# Patient Record
Sex: Female | Born: 1985 | Race: White | Hispanic: No | Marital: Single | Smoking: Former smoker
Health system: Southern US, Community
[De-identification: ages and names within clinical notes are randomized; demographics above are authoritative.]

## PROBLEM LIST (undated history)

## (undated) DIAGNOSIS — D649 Anemia, unspecified: Secondary | ICD-10-CM

## (undated) DIAGNOSIS — B009 Herpesviral infection, unspecified: Secondary | ICD-10-CM

## (undated) DIAGNOSIS — F419 Anxiety disorder, unspecified: Secondary | ICD-10-CM

## (undated) DIAGNOSIS — J45909 Unspecified asthma, uncomplicated: Secondary | ICD-10-CM

## (undated) DIAGNOSIS — Z22322 Carrier or suspected carrier of Methicillin resistant Staphylococcus aureus: Secondary | ICD-10-CM

## (undated) DIAGNOSIS — I10 Essential (primary) hypertension: Secondary | ICD-10-CM

## (undated) HISTORY — PX: OTHER SURGICAL HISTORY: SHX169

---

## 2015-11-11 ENCOUNTER — Encounter (HOSPITAL_COMMUNITY): Payer: Self-pay | Admitting: Emergency Medicine

## 2015-11-11 ENCOUNTER — Emergency Department (HOSPITAL_COMMUNITY): Payer: Self-pay

## 2015-11-11 ENCOUNTER — Emergency Department (HOSPITAL_COMMUNITY)
Admission: EM | Admit: 2015-11-11 | Discharge: 2015-11-11 | Disposition: A | Discharge status: Home / Self Care | Payer: Self-pay | Attending: Emergency Medicine | Admitting: Emergency Medicine

## 2015-11-11 DIAGNOSIS — Z3202 Encounter for pregnancy test, result negative: Secondary | ICD-10-CM | POA: Insufficient documentation

## 2015-11-11 DIAGNOSIS — F17200 Nicotine dependence, unspecified, uncomplicated: Secondary | ICD-10-CM | POA: Insufficient documentation

## 2015-11-11 DIAGNOSIS — R05 Cough: Secondary | ICD-10-CM | POA: Insufficient documentation

## 2015-11-11 DIAGNOSIS — R109 Unspecified abdominal pain: Secondary | ICD-10-CM

## 2015-11-11 DIAGNOSIS — N390 Urinary tract infection, site not specified: Secondary | ICD-10-CM | POA: Insufficient documentation

## 2015-11-11 DIAGNOSIS — Z87442 Personal history of urinary calculi: Secondary | ICD-10-CM | POA: Insufficient documentation

## 2015-11-11 LAB — COMPREHENSIVE METABOLIC PANEL
ALT: 19 U/L (ref 14–54)
AST: 20 U/L (ref 15–41)
Albumin: 3.7 g/dL (ref 3.5–5.0)
Alkaline Phosphatase: 81 U/L (ref 38–126)
Anion gap: 12 (ref 5–15)
BILIRUBIN TOTAL: 0.3 mg/dL (ref 0.3–1.2)
BUN: 17 mg/dL (ref 6–20)
CO2: 24 mmol/L (ref 22–32)
CREATININE: 0.84 mg/dL (ref 0.44–1.00)
Calcium: 9.1 mg/dL (ref 8.9–10.3)
Chloride: 101 mmol/L (ref 101–111)
GFR calc Af Amer: >60 mL/min (ref >60)
Glucose, Bld: 87 mg/dL (ref 65–99)
POTASSIUM: 4.1 mmol/L (ref 3.5–5.1)
Sodium: 137 mmol/L (ref 135–145)
TOTAL PROTEIN: 7.8 g/dL (ref 6.5–8.1)

## 2015-11-11 LAB — CBC
HCT: 37.7 % (ref 36.0–46.0)
Hemoglobin: 11.8 g/dL — ABNORMAL LOW (ref 12.0–15.0)
MCH: 28.4 pg (ref 26.0–34.0)
MCHC: 31.3 g/dL (ref 30.0–36.0)
MCV: 90.8 fL (ref 78.0–100.0)
PLATELETS: 380 10*3/uL (ref 150–400)
RBC: 4.15 MIL/uL (ref 3.87–5.11)
RDW: 16.3 % — AB (ref 11.5–15.5)
WBC: 12.5 10*3/uL — AB (ref 4.0–10.5)

## 2015-11-11 LAB — URINALYSIS, ROUTINE W REFLEX MICROSCOPIC
Bilirubin Urine: NEGATIVE
Glucose, UA: NEGATIVE mg/dL
Hgb urine dipstick: NEGATIVE
KETONES UR: NEGATIVE mg/dL
NITRITE: POSITIVE — AB
PROTEIN: NEGATIVE mg/dL
Specific Gravity, Urine: 1.022 (ref 1.005–1.030)
pH: 6 (ref 5.0–8.0)

## 2015-11-11 LAB — URINE MICROSCOPIC-ADD ON: RBC / HPF: NONE SEEN RBC/hpf (ref 0–5)

## 2015-11-11 LAB — POC URINE PREG, ED: Preg Test, Ur: NEGATIVE

## 2015-11-11 LAB — LIPASE, BLOOD: Lipase: 24 U/L (ref 11–51)

## 2015-11-11 MED ORDER — CEPHALEXIN 500 MG PO CAPS: 500.0000 mg | ORAL_CAPSULE | Freq: Three times a day (TID) | ORAL | Status: DC

## 2015-11-11 MED ORDER — IBUPROFEN 800 MG PO TABS: 800.0000 mg | ORAL_TABLET | Freq: Three times a day (TID) | ORAL | Status: DC | PRN

## 2015-11-11 NOTE — ED Notes (Signed)
Pt ambulating independently w/ steady gait on d/c in no acute distress, A&Ox4. D/c instructions reviewed w/ pt and family - pt and family deny any further questions or concerns at present. Rx given x2  

## 2015-11-11 NOTE — Discharge Instructions (Signed)
Read the information below.  Use the prescribed medication as directed.  Please discuss all new medications with your pharmacist.  You may return to the Emergency Department at any time for worsening condition or any new symptoms that concern you.   If you develop high fevers, worsening abdominal pain, uncontrolled vomiting, or are unable to tolerate fluids by mouth, return to the ER for a recheck.     Flank Pain Flank pain refers to pain that is located on the side of the body between the upper abdomen and the back. The pain may occur over a short period of time (acute) or may be long-term or reoccurring (chronic). It may be mild or severe. Flank pain can be caused by many things. CAUSES  Some of the more common causes of flank pain include:  Muscle strains.   Muscle spasms.   A disease of your spine (vertebral disk disease).   A lung infection (pneumonia).   Fluid around your lungs (pulmonary edema).   A kidney infection.   Kidney stones.   A very painful skin rash caused by the chickenpox virus (shingles).   Gallbladder disease.  HOME CARE INSTRUCTIONS  Home care will depend on the cause of your pain. In general,  Rest as directed by your caregiver.  Drink enough fluids to keep your urine clear or pale yellow.  Only take over-the-counter or prescription medicines as directed by your caregiver. Some medicines may help relieve the pain.  Tell your caregiver about any changes in your pain.  Follow up with your caregiver as directed. SEEK IMMEDIATE MEDICAL CARE IF:   Your pain is not controlled with medicine.   You have new or worsening symptoms.  Your pain increases.   You have abdominal pain.   You have shortness of breath.   You have persistent nausea or vomiting.   You have swelling in your abdomen.   You feel faint or pass out.   You have blood in your urine.  You have a fever or persistent symptoms for more than 2-3 days.  You have a  fever and your symptoms suddenly get worse. MAKE SURE YOU:   Understand these instructions.  Will watch your condition.  Will get help right away if you are not doing well or get worse.   This information is not intended to replace advice given to you by your health care provider. Make sure you discuss any questions you have with your health care provider.   Document Released: 10/18/2005 Document Revised: 05/21/2012 Document Reviewed: 04/10/2012 Elsevier Interactive Patient Education 2016 [employer]  Urinary Tract Infection Urinary tract infections (UTIs) can develop anywhere along your urinary tract. Your urinary tract is your body's drainage system for removing wastes and extra water. Your urinary tract includes two kidneys, two ureters, a bladder, and a urethra. Your kidneys are a pair of bean-shaped organs. Each kidney is about the size of your fist. They are located below your ribs, one on each side of your spine. CAUSES Infections are caused by microbes, which are microscopic organisms, including fungi, viruses, and bacteria. These organisms are so small that they can only be seen through a microscope. Bacteria are the microbes that most commonly cause UTIs. SYMPTOMS  Symptoms of UTIs may vary by age and gender of the patient and by the location of the infection. Symptoms in young women typically include a frequent and intense urge to urinate and a painful, burning feeling in the bladder or urethra during urination. Older women  and men are more likely to be tired, shaky, and weak and have muscle aches and abdominal pain. A fever may mean the infection is in your kidneys. Other symptoms of a kidney infection include pain in your back or sides below the ribs, nausea, and vomiting. DIAGNOSIS To diagnose a UTI, your caregiver will ask you about your symptoms. Your caregiver will also ask you to provide a urine sample. The urine sample will be tested for bacteria and white blood cells.  White blood cells are made by your body to help fight infection. TREATMENT  Typically, UTIs can be treated with medication. Because most UTIs are caused by a bacterial infection, they usually can be treated with the use of antibiotics. The choice of antibiotic and length of treatment depend on your symptoms and the type of bacteria causing your infection. HOME CARE INSTRUCTIONS  If you were prescribed antibiotics, take them exactly as your caregiver instructs you. Finish the medication even if you feel better after you have only taken some of the medication.  Drink enough water and fluids to keep your urine clear or pale yellow.  Avoid caffeine, tea, and carbonated beverages. They tend to irritate your bladder.  Empty your bladder often. Avoid holding urine for long periods of time.  Empty your bladder before and after sexual intercourse.  After a bowel movement, women should cleanse from front to back. Use each tissue only once. SEEK MEDICAL CARE IF:   You have back pain.  You develop a fever.  Your symptoms do not begin to resolve within 3 days. SEEK IMMEDIATE MEDICAL CARE IF:   You have severe back pain or lower abdominal pain.  You develop chills.  You have nausea or vomiting.  You have continued burning or discomfort with urination. MAKE SURE YOU:   Understand these instructions.  Will watch your condition.  Will get help right away if you are not doing well or get worse.   This information is not intended to replace advice given to you by your health care provider. Make sure you discuss any questions you have with your health care provider.   Document Released: 06/06/2005 Document Revised: 05/18/2015 Document Reviewed: 10/05/2011 Elsevier Interactive Patient Education [employer]

## 2015-11-11 NOTE — ED Provider Notes (Signed)
CSN: [id number]     Arrival date & time 11/11/15  1839 History  By signing my name below, I, Freida Busman, attest that this documentation has been prepared under the direction and in the presence of non-physician practitioner, Trixie Dredge, PA-C. Electronically Signed: Freida Busman, Scribe. 11/11/2015. 8:23 PM.    Chief Complaint  Patient presents with  . Abdominal Pain    The history is provided by the patient. No language interpreter was used.     HPI Comments:  Paula Mcbride is a 30 y.o. female who presents to the Emergency Department complaining of shooting constant right lateral side pain x 3-4 days. She notes her pain is worse with inspiration and cough. She denies acute trauma and exacerbation of pain after eating. Pt reports associated intermittent left pelvic pain x 8 days (since the beginning of her period) that has improved, chills, malodorous urine, states her " pee smells strong". Pt reports h/o similar pain; states she was diagnosed with a  kidney stone. Her last menstrual period started 8 days ago and ended yesterday; notes increased cramping and heavier flow but states it was normal otherwise. Pt denies dysuria, vaginal discharge, dyspareuria, fever, CP, SOB, LE swelling, and change in appetite. She has been taking tylenol with minimal relief. She denies recent surgery/long periods of immobilization, use of BCP/hormone replacement, leg swelling.  History reviewed. No pertinent past medical history. Past Surgical History  Procedure Laterality Date  . Thumb surgery     No family history on file. Social History  Substance Use Topics  . Smoking status: Current Every Day Smoker  . Smokeless tobacco: None  . Alcohol Use: No   OB History    No data available     Review of Systems  Constitutional: Negative for fever and appetite change.  Respiratory: Negative for shortness of breath.   Cardiovascular: Negative for chest pain and leg swelling.  Gastrointestinal:  Positive for abdominal pain.  Genitourinary: Negative for vaginal discharge and dyspareunia.  All other systems reviewed and are negative.   Allergies  Review of patient's allergies indicates not on file.  Home Medications   Prior to Admission medications   Medication Sig Start Date End Date Taking? Authorizing Provider  cephALEXin (KEFLEX) 500 MG capsule Take 1 capsule (500 mg total) by mouth 3 (three) times daily. 11/11/15   Trixie Dredge, PA-C  ibuprofen (ADVIL,MOTRIN) 800 MG tablet Take 1 tablet (800 mg total) by mouth every 8 (eight) hours as needed for mild pain or moderate pain. 11/11/15   Trixie Dredge, PA-C   BP 144/104 mmHg  Pulse 102  Temp(Src) 97.8 F (36.6 C) (Oral)  Resp 18  Ht  (1.575 m)  Wt 152 lb 12.8 oz (69.31 kg)  BMI 27.94 kg/m2  SpO2 99%  LMP 11/02/2015 Physical Exam  Constitutional: She appears well-developed and well-nourished. No distress.  HENT:  Head: Normocephalic and atraumatic.  Neck: Neck supple.  Cardiovascular: Normal rate and regular rhythm.   Pulmonary/Chest: Effort normal and breath sounds normal. No respiratory distress. She has no wheezes. She has no rales.  Abdominal: Soft. She exhibits no distension. There is tenderness. There is no rebound, no guarding and no CVA tenderness.  Left suprapubic tenderness No CVA tenderness  Neurological: She is alert. She exhibits normal muscle tone.  Skin: She is not diaphoretic.  Nursing note and vitals reviewed.   ED Course  Procedures   DIAGNOSTIC STUDIES:  Oxygen Saturation is 99% on RA, normal by my interpretation.  COORDINATION OF CARE:  8:16 PM Discussed treatment plan with pt at bedside and pt agreed to plan.  Labs Review Labs Reviewed  CBC - Abnormal; Notable for the following:    WBC 12.5 (*)    Hemoglobin 11.8 (*)    RDW 16.3 (*)    All other components within normal limits  URINALYSIS, ROUTINE W REFLEX MICROSCOPIC (NOT AT [facility]) - Abnormal; Notable for the following:    Nitrite  POSITIVE (*)    Leukocytes, UA SMALL (*)    All other components within normal limits  URINE MICROSCOPIC-ADD ON - Abnormal; Notable for the following:    Squamous Epithelial / LPF 0-5 (*)    Bacteria, UA MANY (*)    All other components within normal limits  WET PREP, GENITAL  LIPASE, BLOOD  COMPREHENSIVE METABOLIC PANEL  RPR  HIV ANTIBODY (ROUTINE TESTING)  POC URINE PREG, ED  GC/CHLAMYDIA PROBE AMP ([place]) NOT AT [facility]    Imaging Review Dg Chest 2 View  11/11/2015  CLINICAL DATA:  RUQ pleuritic pain x 4 days. Smoker. No other chest complaints EXAM: CHEST  2 VIEW COMPARISON:  None. FINDINGS: Lateral view degraded by patient arm position. Midline trachea. Normal heart size and mediastinal contours. No pleural effusion or pneumothorax. Clear lungs. IMPRESSION: Normal chest. Electronically Signed   By: Jeronimo GreavesKyle  Talbot M.D.   On: 11/11/2015 20:39   I have personally reviewed and evaluated these images and lab results as part of my medical decision-making.  9:37 PM Pt declined pelvic exam. Discussed with patient that without this testing we have limited information and data with which to diagnose her. She verbalizes understanding, agrees to return for worsening condition.  MDM   Final diagnoses:  Right flank pain  UTI (lower urinary tract infection)    Afebrile, nontoxic patient with right flank pain and left suprapubic pain - suprapubic cramping began at the beginning of her menstrual period 8 days ago.  Right flank pain has been associated with malodorous urine.  No fever, vomiting, bowel changes, vaginal symptoms.  Pt active, does not appear to be in any pain, eager to eat throughout visit.  UA appears infected, mild leukocytosis (WBC 12).  Workup otherwise reassuring.   Pt is not pregnant.  Pt may be developing early pyelonephritis but is not toxic or septic appearing.  Doubt PE, pt has no known risk factors.  D/C home with keflex, ibuprofen, PCP follow up.   Discussed result,  findings, treatment, and follow up  with patient.  Pt given return precautions.  Pt verbalizes understanding and agrees with plan.       I personally performed the services described in this documentation, which was scribed in my presence. The recorded information has been reviewed and is accurate.    Trixie Dredgemily Creedence Kunesh, PA-C 11/11/15 2217  Doug SouSam Jacubowitz, MD 11/12/15 0130

## 2015-11-11 NOTE — ED Notes (Signed)
C/o L sided pelvic pain and R lateral side pain x 3 days with chills and headache. Denies nausea, vomiting.  States urine is foul smelling.

## 2016-04-14 ENCOUNTER — Encounter (HOSPITAL_COMMUNITY): Payer: Self-pay | Admitting: *Deleted

## 2016-04-14 DIAGNOSIS — M545 Low back pain: Secondary | ICD-10-CM | POA: Insufficient documentation

## 2016-04-14 DIAGNOSIS — F17200 Nicotine dependence, unspecified, uncomplicated: Secondary | ICD-10-CM | POA: Insufficient documentation

## 2016-04-14 DIAGNOSIS — J45909 Unspecified asthma, uncomplicated: Secondary | ICD-10-CM | POA: Insufficient documentation

## 2016-04-14 DIAGNOSIS — R002 Palpitations: Secondary | ICD-10-CM | POA: Insufficient documentation

## 2016-04-14 LAB — URINALYSIS, ROUTINE W REFLEX MICROSCOPIC
Bilirubin Urine: NEGATIVE
Glucose, UA: NEGATIVE mg/dL
HGB URINE DIPSTICK: NEGATIVE
Ketones, ur: NEGATIVE mg/dL
LEUKOCYTES UA: NEGATIVE
Nitrite: NEGATIVE
PROTEIN: NEGATIVE mg/dL
SPECIFIC GRAVITY, URINE: 1.019 (ref 1.005–1.030)
pH: 6.5 (ref 5.0–8.0)

## 2016-04-14 LAB — CBC WITH DIFFERENTIAL/PLATELET
BASOS ABS: 0.1 10*3/uL (ref 0.0–0.1)
BASOS PCT: 1 %
EOS PCT: 3 %
Eosinophils Absolute: 0.3 10*3/uL (ref 0.0–0.7)
HCT: 39.8 % (ref 36.0–46.0)
Hemoglobin: 13.1 g/dL (ref 12.0–15.0)
Lymphocytes Relative: 41 %
Lymphs Abs: 4.5 10*3/uL — ABNORMAL HIGH (ref 0.7–4.0)
MCH: 30.6 pg (ref 26.0–34.0)
MCHC: 32.9 g/dL (ref 30.0–36.0)
MCV: 93 fL (ref 78.0–100.0)
MONO ABS: 1 10*3/uL (ref 0.1–1.0)
MONOS PCT: 9 %
Neutro Abs: 5.1 10*3/uL (ref 1.7–7.7)
Neutrophils Relative %: 46 %
PLATELETS: 259 10*3/uL (ref 150–400)
RBC: 4.28 MIL/uL (ref 3.87–5.11)
RDW: 16.4 % — AB (ref 11.5–15.5)
WBC: 11 10*3/uL — ABNORMAL HIGH (ref 4.0–10.5)

## 2016-04-14 LAB — POC URINE PREG, ED: PREG TEST UR: NEGATIVE

## 2016-04-14 NOTE — ED Triage Notes (Signed)
Patient presents stating she has been having palpitations off and on for awhile but has been persistant for the past 2-3 days

## 2016-04-15 ENCOUNTER — Emergency Department (HOSPITAL_COMMUNITY)
Admission: EM | Admit: 2016-04-15 | Discharge: 2016-04-15 | Disposition: A | Discharge status: Home / Self Care | Payer: Self-pay | Attending: Emergency Medicine | Admitting: Emergency Medicine

## 2016-04-15 DIAGNOSIS — M545 Low back pain, unspecified: Secondary | ICD-10-CM

## 2016-04-15 DIAGNOSIS — R002 Palpitations: Secondary | ICD-10-CM

## 2016-04-15 HISTORY — DX: Herpesviral infection, unspecified: B00.9

## 2016-04-15 HISTORY — DX: Unspecified asthma, uncomplicated: J45.909

## 2016-04-15 LAB — COMPREHENSIVE METABOLIC PANEL WITH GFR
ALT: 18 U/L (ref 14–54)
AST: 20 U/L (ref 15–41)
Albumin: 4 g/dL (ref 3.5–5.0)
Alkaline Phosphatase: 82 U/L (ref 38–126)
Anion gap: 7 (ref 5–15)
BUN: 21 mg/dL — ABNORMAL HIGH (ref 6–20)
CO2: 26 mmol/L (ref 22–32)
Calcium: 10.3 mg/dL (ref 8.9–10.3)
Chloride: 103 mmol/L (ref 101–111)
Creatinine, Ser: 0.74 mg/dL (ref 0.44–1.00)
GFR calc Af Amer: >60 mL/min
GFR calc non Af Amer: >60 mL/min
Glucose, Bld: 88 mg/dL (ref 65–99)
Potassium: 3.8 mmol/L (ref 3.5–5.1)
Sodium: 136 mmol/L (ref 135–145)
Total Bilirubin: 0.6 mg/dL (ref 0.3–1.2)
Total Protein: 7.3 g/dL (ref 6.5–8.1)

## 2016-04-15 MED ORDER — METHOCARBAMOL 500 MG PO TABS: 500.0000 mg | ORAL_TABLET | Freq: Two times a day (BID) | ORAL | 0 refills | Status: DC

## 2016-04-15 NOTE — ED Notes (Signed)
Pt departed in NAD, refused use of wheelchair.  

## 2016-04-15 NOTE — ED Provider Notes (Signed)
[facility]-EMERGENCY DEPT Provider Note   CSN: [id number] Arrival date & time: 04/14/16  2307  First Provider Contact:  None       History   Chief Complaint Chief Complaint  Patient presents with  . Palpitations    HPI Paula Mcbride is a 30 y.o. female.  Patient presents today with palpitations.  She states that she has been having intermittent palpitations over the past 2-3 days.  She has a history of the same.  She states that she has never had a Holter Monitor or been seen by Cardiology for this in the past.  She denies any chest pain, SOB, dizziness, or syncope.  No fever or chills.  She reports drinking one cup of coffee today, but denies any other Caffeine use.  Denies recreational drugs or alcohol use.  Denies increased anxiety.  She has not taken anything for her symptoms.       Past Medical History:  Diagnosis Date  . Asthma   . Herpes     There are no active problems to display for this patient.   Past Surgical History:  Procedure Laterality Date  . thumb surgery      OB History    No data available       Home Medications    Prior to Admission medications   Medication Sig Start Date End Date Taking? Authorizing Provider  cephALEXin (KEFLEX) 500 MG capsule Take 1 capsule (500 mg total) by mouth 3 (three) times daily. 11/11/15   Trixie DredgeEmily West, PA-C  ibuprofen (ADVIL,MOTRIN) 800 MG tablet Take 1 tablet (800 mg total) by mouth every 8 (eight) hours as needed for mild pain or moderate pain. 11/11/15   Trixie DredgeEmily West, PA-C    Family History No family history on file.  Social History Social History  Substance Use Topics  . Smoking status: Current Every Day Smoker  . Smokeless tobacco: Never Used  . Alcohol use Yes     Comment: occasionally     Allergies   Review of patient's allergies indicates no known allergies.   Review of Systems Review of Systems  All other systems reviewed and are negative.    Physical Exam Updated Vital Signs BP 135/86  (BP Location: Right Arm)   Pulse 96   Temp 98.3 F (36.8 C) (Oral)   Resp 20   Ht 5\' 2"  (1.575 m)   Wt 77 kg   LMP 03/12/2016   SpO2 100%   BMI 31.04 kg/m   Physical Exam  Constitutional: She appears well-developed and well-nourished.  HENT:  Head: Normocephalic and atraumatic.  Neck: Normal range of motion. Neck supple.  Cardiovascular: Normal rate, regular rhythm and normal heart sounds.  Exam reveals no gallop and no friction rub.   No murmur heard. Pulmonary/Chest: Effort normal and breath sounds normal.  Abdominal: Soft. There is no tenderness.  Musculoskeletal: Normal range of motion.  Neurological: She is alert.  Skin: Skin is warm and dry.  Psychiatric: She has a normal mood and affect.  Nursing note and vitals reviewed.    ED Treatments / Results  Labs (all labs ordered are listed, but only abnormal results are displayed) Labs Reviewed  CBC WITH DIFFERENTIAL/PLATELET - Abnormal; Notable for the following:       Result Value   WBC 11.0 (*)    RDW 16.4 (*)    Lymphs Abs 4.5 (*)    All other components within normal limits  COMPREHENSIVE METABOLIC PANEL - Abnormal; Notable for the following:  BUN 21 (*)    All other components within normal limits  URINALYSIS, ROUTINE W REFLEX MICROSCOPIC (NOT AT [facility])  POC URINE PREG, ED    EKG  EKG Interpretation None       Radiology No results found.  Procedures Procedures (including critical care time)  Medications Ordered in ED Medications - No data to display   Initial Impression / Assessment and Plan / ED Course  I have reviewed the triage vital signs and the nursing notes.  Pertinent labs & imaging results that were available during my care of the patient were reviewed by me and considered in my medical decision making (see chart for details).  Clinical Course    Patient presents today with palpitations that have been occurring intermittently over the past couple of days.  Normal EKG.  Labs  unremarkable.  She denies any CP or SOB.  Denies recreational drug use.  VSS.  Feel that the patient is stable for discharge.  Patient given referral to Cardiology for possible Holter Monitor.  Return precautions given.    Final Clinical Impressions(s) / ED Diagnoses   Final diagnoses:  None    New Prescriptions New Prescriptions   No medications on file     Santiago Glad, PA-C 04/16/16 2107    Tomasita Crumble, MD 04/17/16 [id number]

## 2017-01-07 IMAGING — DX DG CHEST 2V
2 series · 2 of 2 positions shown · non-contrast
Comparison: None.

CLINICAL DATA: RUQ pleuritic pain x 4 days. Smoker. No other chest
complaints

EXAM:
CHEST  2 VIEW

[chest pa]
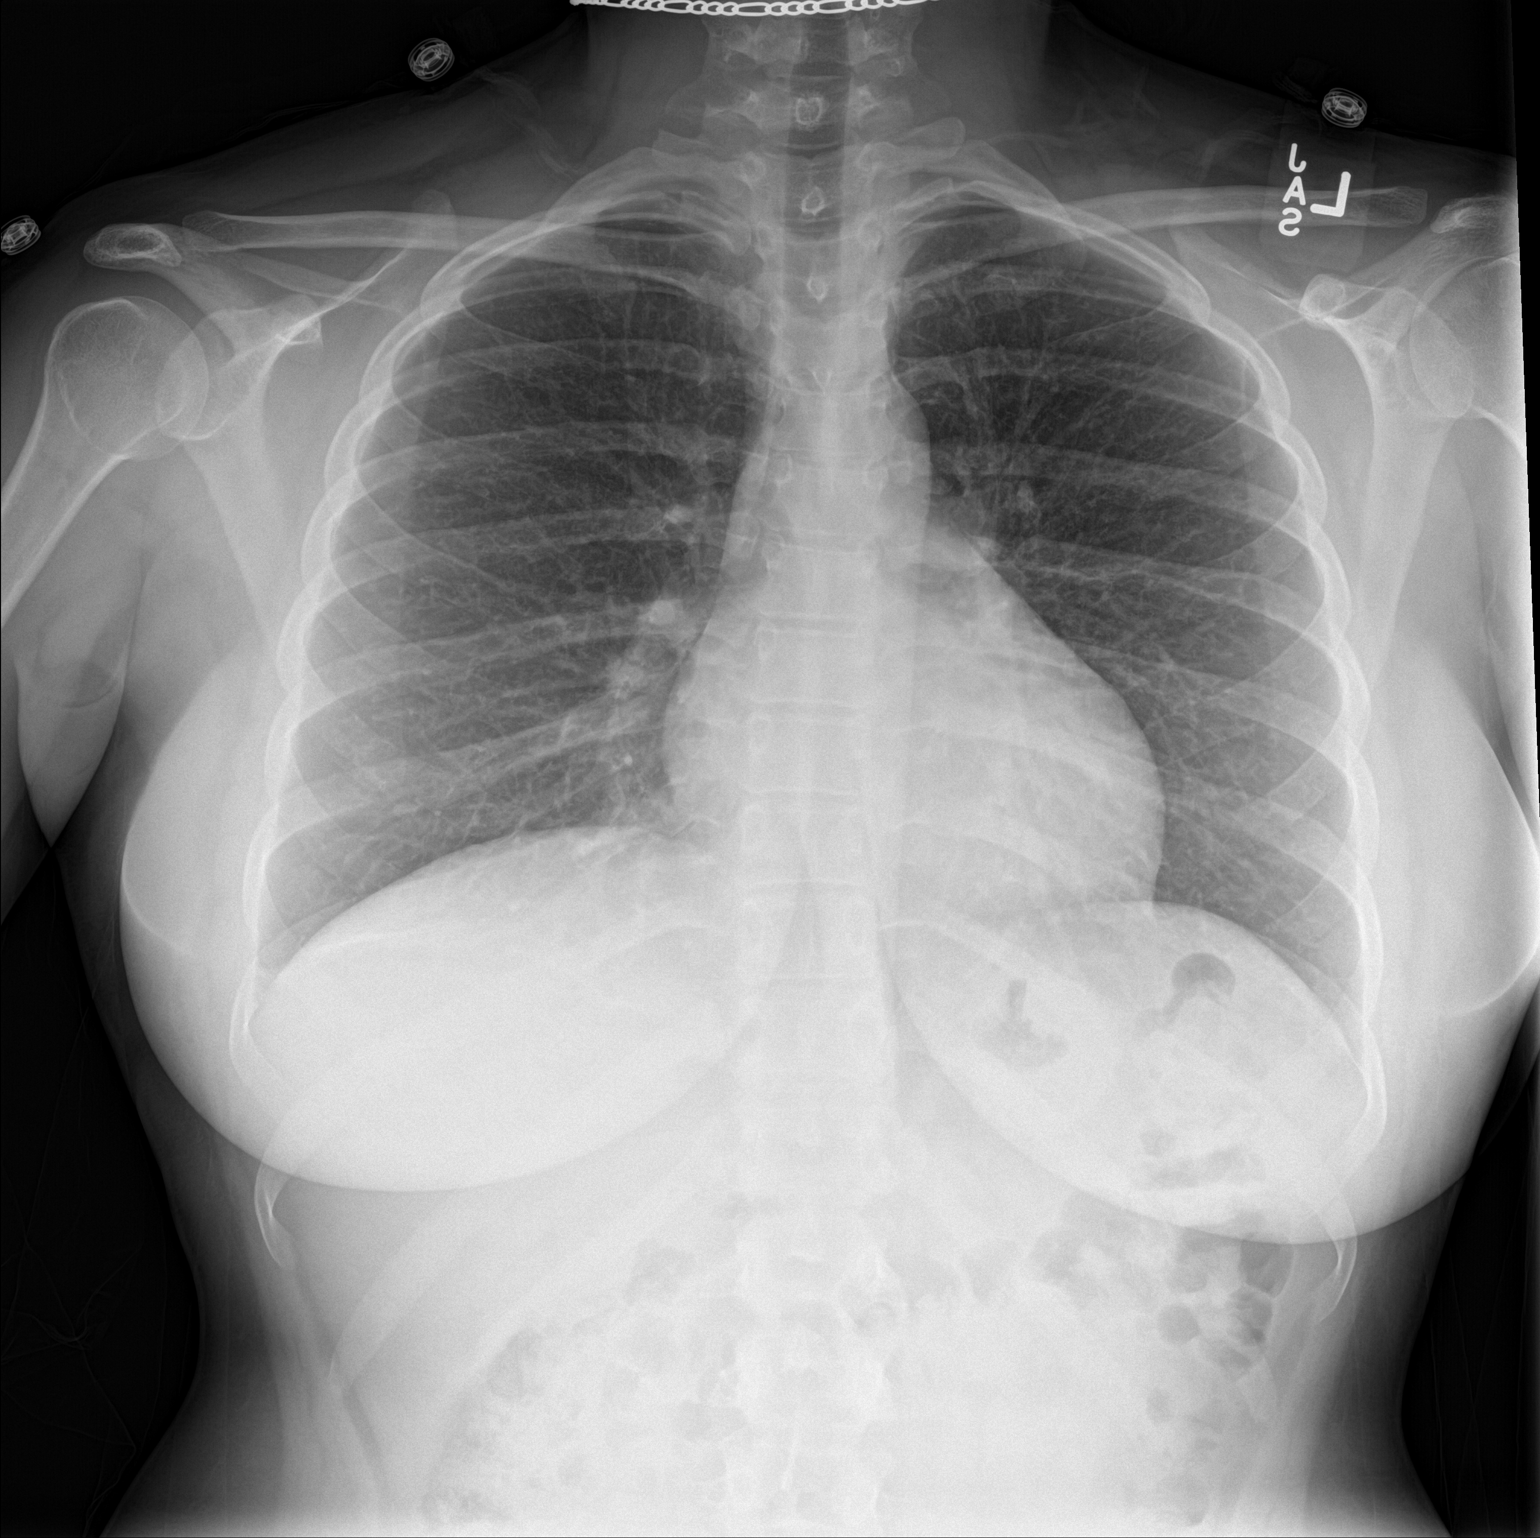

[chest lat]
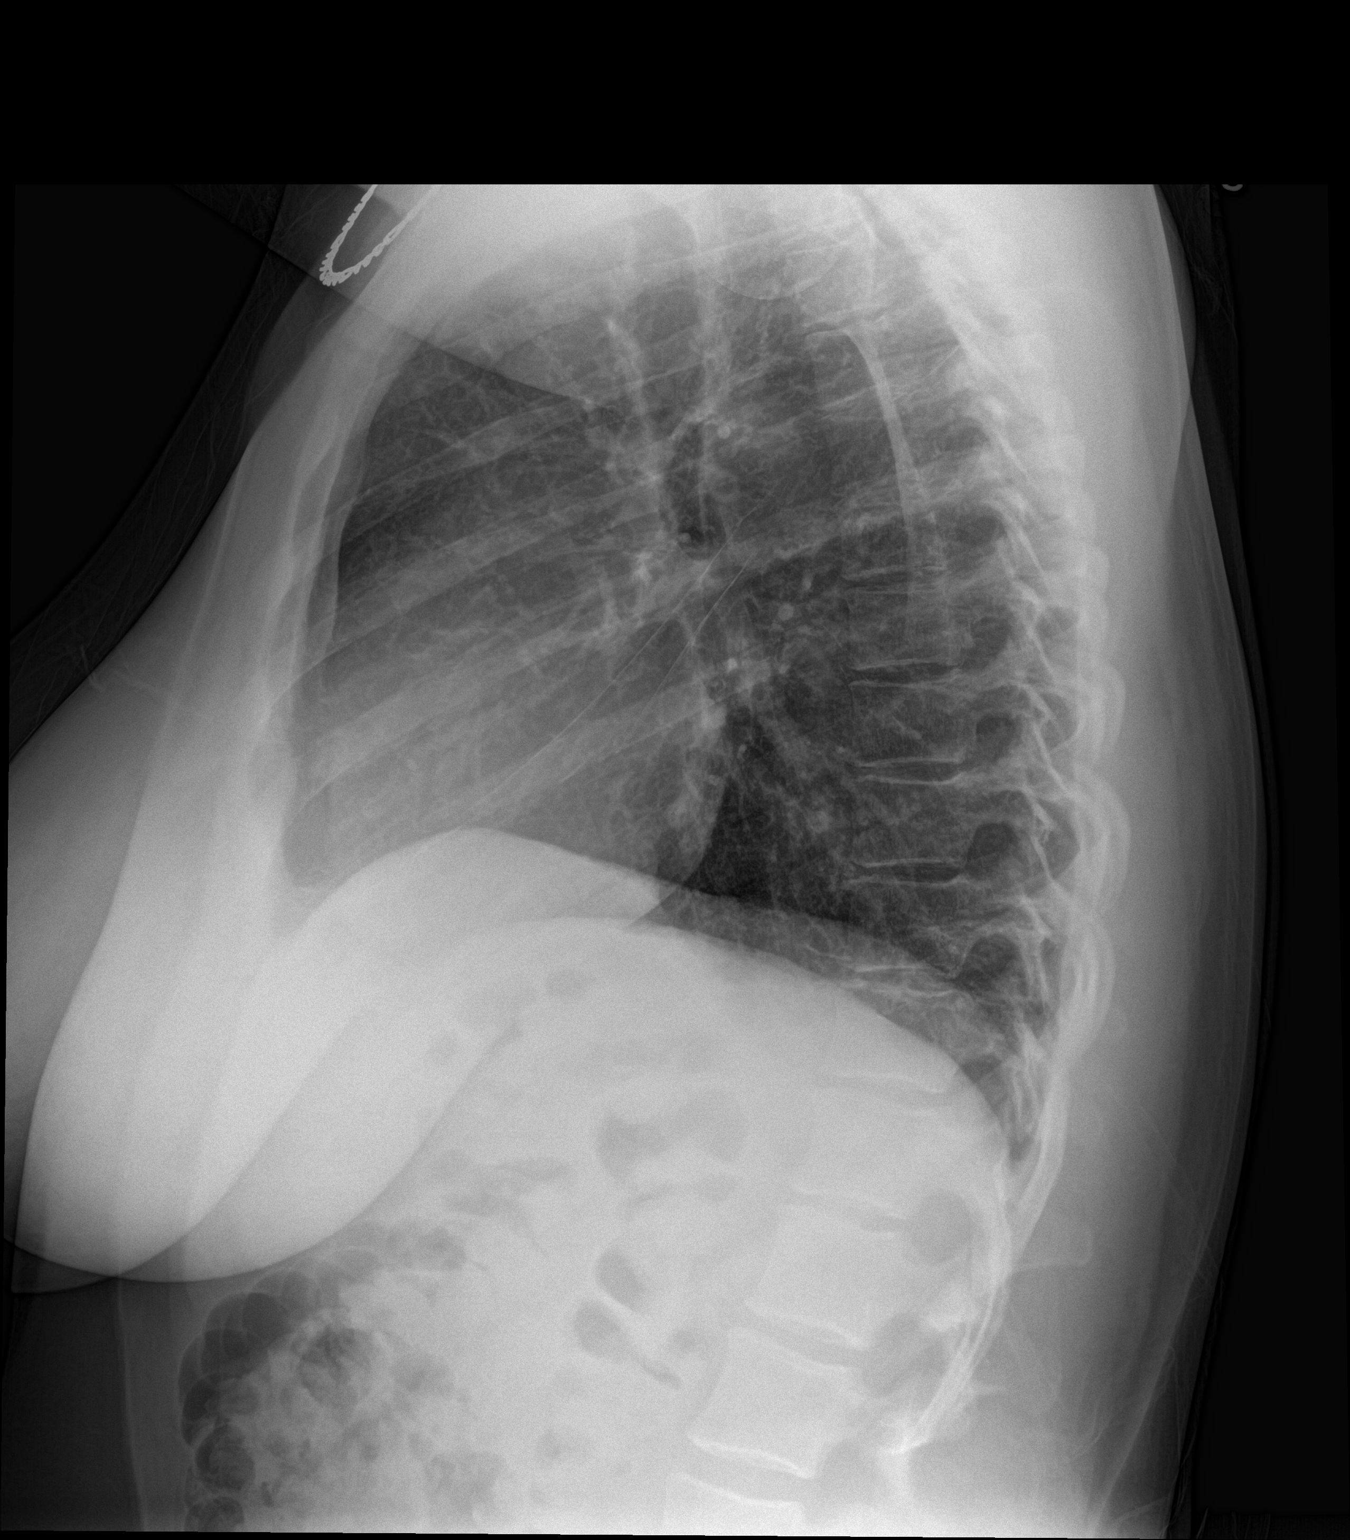

[2 of 2 positions shown; findings below may reference images not displayed]

FINDINGS: Lateral view degraded by patient arm position. Midline trachea.
Normal heart size and mediastinal contours. No pleural effusion or
pneumothorax. Clear lungs.
IMPRESSION: Normal chest.

## 2023-01-27 ENCOUNTER — Emergency Department (HOSPITAL_BASED_OUTPATIENT_CLINIC_OR_DEPARTMENT_OTHER)
Admission: EM | Admit: 2023-01-27 | Discharge: 2023-01-27 | Disposition: A | Discharge status: Home / Self Care | Payer: No Typology Code available for payment source | Attending: Emergency Medicine | Admitting: Emergency Medicine

## 2023-01-27 ENCOUNTER — Encounter (HOSPITAL_BASED_OUTPATIENT_CLINIC_OR_DEPARTMENT_OTHER): Payer: Self-pay | Admitting: Emergency Medicine

## 2023-01-27 DIAGNOSIS — Z1152 Encounter for screening for COVID-19: Secondary | ICD-10-CM | POA: Diagnosis not present

## 2023-01-27 DIAGNOSIS — R590 Localized enlarged lymph nodes: Secondary | ICD-10-CM | POA: Diagnosis not present

## 2023-01-27 DIAGNOSIS — J029 Acute pharyngitis, unspecified: Secondary | ICD-10-CM | POA: Diagnosis present

## 2023-01-27 DIAGNOSIS — J45909 Unspecified asthma, uncomplicated: Secondary | ICD-10-CM | POA: Diagnosis not present

## 2023-01-27 DIAGNOSIS — R519 Headache, unspecified: Secondary | ICD-10-CM | POA: Insufficient documentation

## 2023-01-27 DIAGNOSIS — J069 Acute upper respiratory infection, unspecified: Secondary | ICD-10-CM | POA: Insufficient documentation

## 2023-01-27 HISTORY — DX: Anemia, unspecified: D64.9

## 2023-01-27 HISTORY — DX: Essential (primary) hypertension: I10

## 2023-01-27 HISTORY — DX: Anxiety disorder, unspecified: F41.9

## 2023-01-27 HISTORY — DX: Carrier or suspected carrier of methicillin resistant Staphylococcus aureus: Z22.322

## 2023-01-27 LAB — RESP PANEL BY RT-PCR (RSV, FLU A&B, COVID)  RVPGX2
Influenza A by PCR: NEGATIVE
Influenza B by PCR: NEGATIVE
Resp Syncytial Virus by PCR: NEGATIVE
SARS Coronavirus 2 by RT PCR: NEGATIVE

## 2023-01-27 LAB — GROUP A STREP BY PCR: Group A Strep by PCR: NOT DETECTED

## 2023-01-27 MED ORDER — DIPHENHYDRAMINE HCL 25 MG PO CAPS
25.0000 mg | ORAL_CAPSULE | Freq: Once | ORAL | Status: AC
  Administered 2023-01-27: 25 mg via ORAL
  Filled 2023-01-27: qty 1

## 2023-01-27 MED ORDER — BENZONATATE 100 MG PO CAPS: 100.0000 mg | ORAL_CAPSULE | Freq: Three times a day (TID) | ORAL | 0 refills | Status: AC

## 2023-01-27 MED ORDER — METOCLOPRAMIDE HCL 5 MG/ML IJ SOLN
10.0000 mg | Freq: Once | INTRAMUSCULAR | Status: AC
  Administered 2023-01-27: 10 mg via INTRAMUSCULAR
  Filled 2023-01-27: qty 2

## 2023-01-27 NOTE — ED Triage Notes (Signed)
Pt w/ sore throat, HA, congestion, cough x 3d

## 2023-01-27 NOTE — Discharge Instructions (Signed)
Please read and follow all provided instructions.  Your diagnoses today include:  1. Upper respiratory tract infection, unspecified type   2. Acute nonintractable headache, unspecified headache type     Tests performed today include: Strep, flu, COVID, RSV testing negative Vital signs. See below for your results today.   Medications:  In the Emergency Department you received: Reglan - antinausea/headache medication Benadryl - antihistamine to counteract potential side effects of reglan  Tessalon Perles - cough suppressant medication  Take any prescribed medications only as directed.  Additional information:  Follow any educational materials contained in this packet.  You are having a headache. No specific cause was found today for your headache. It may have been a migraine or other cause of headache. Stress, anxiety, fatigue, and depression are common triggers for headaches.   Your headache today does not appear to be life-threatening or require hospitalization, but often the exact cause of headaches is not determined in the emergency department. Therefore, follow-up with your doctor is very important to find out what may have caused your headache and whether or not you need any further diagnostic testing or treatment.   Sometimes headaches can appear benign (not harmful), but then more serious symptoms can develop which should prompt an immediate re-evaluation by your doctor or the emergency department.  BE VERY CAREFUL not to take multiple medicines containing Tylenol (also called acetaminophen). Doing so can lead to an overdose which can damage your liver and cause liver failure and possibly death.   Follow-up instructions: Please follow-up with your primary care provider in the next 3 days for further evaluation of your symptoms.   Return instructions:  Please return to the Emergency Department if you experience worsening symptoms. Return if the medications do not resolve your  headache, if it recurs, or if you have multiple episodes of vomiting or cannot keep down fluids. Return if you have a change from the usual headache. RETURN IMMEDIATELY IF you: Develop a sudden, severe headache Develop confusion or become poorly responsive or faint Develop a fever above 100.81F or problem breathing Have a change in speech, vision, swallowing, or understanding Develop new weakness, numbness, tingling, incoordination in your arms or legs Have a seizure Please return if you have any other emergent concerns.  Additional Information:  Your vital signs today were: BP 106/73   Pulse 78   Temp 97.7 F (36.5 C)   Resp 16   Ht 5\' 3"  (1.6 m)   Wt 65.3 kg   LMP 01/27/2023   SpO2 100%   BMI 25.51 kg/m  If your blood pressure (BP) was elevated above 135/85 this visit, please have this repeated by your doctor within one month. --------------

## 2023-01-27 NOTE — ED Provider Notes (Signed)
[facility] Provider Note   CSN: [id number] Arrival date & time: 01/27/23  1242     History  Chief Complaint  Patient presents with   Sore Throat    Paula Mcbride is a 37 y.o. female.  Patient with history of asthma presents to the emergency department for 3 days of nasal congestion and sore throat.  She states that the nasal congestion is making it hard to breathe.  She also has a generalized "migraine".  She has light sensitivity but no vomiting.  No neck pain or confusion.  She has been having nonproductive cough.  She denies wheezing.  No vomiting or diarrhea.  No urinary symptoms.  No treatments prior to arrival.  No known sick contacts.       Home Medications Prior to Admission medications   Medication Sig Start Date End Date Taking? Authorizing Provider  benzonatate (TESSALON) 100 MG capsule Take 1 capsule (100 mg total) by mouth every 8 (eight) hours. 01/27/23  Yes Renne Crigler, PA-C      Allergies    Patient has no known allergies.    Review of Systems   Review of Systems  Physical Exam Updated Vital Signs BP 106/73   Pulse 78   Temp 97.7 F (36.5 C)   Resp 16   Ht 5\' 3"  (1.6 m)   Wt 65.3 kg   LMP 01/27/2023   SpO2 100%   BMI 25.51 kg/m  Physical Exam Vitals and nursing note reviewed.  Constitutional:      Appearance: She is well-developed.  HENT:     Head: Normocephalic and atraumatic.     Jaw: No trismus.     Right Ear: Tympanic membrane, ear canal and external ear normal.     Left Ear: Tympanic membrane, ear canal and external ear normal.     Nose: Nose normal. No mucosal edema or rhinorrhea.     Mouth/Throat:     Mouth: Mucous membranes are moist. Mucous membranes are not dry. No oral lesions.     Pharynx: Uvula midline. Posterior oropharyngeal erythema present. No oropharyngeal exudate or uvula swelling.     Tonsils: No tonsillar abscesses.  Eyes:     General:        Right eye: No  discharge.        Left eye: No discharge.     Conjunctiva/sclera: Conjunctivae normal.  Cardiovascular:     Rate and Rhythm: Normal rate and regular rhythm.     Heart sounds: Normal heart sounds.  Pulmonary:     Effort: Pulmonary effort is normal. No respiratory distress.     Breath sounds: Normal breath sounds. No wheezing or rales.  Abdominal:     Palpations: Abdomen is soft.     Tenderness: There is no abdominal tenderness.  Musculoskeletal:     Cervical back: Normal range of motion and neck supple.  Lymphadenopathy:     Cervical: Cervical adenopathy present.  Skin:    General: Skin is warm and dry.  Neurological:     Mental Status: She is alert.     Cranial Nerves: Cranial nerves 2-12 are intact.     Sensory: Sensation is intact.     Motor: Motor function is intact.     Coordination: Coordination is intact.  Psychiatric:        Mood and Affect: Mood normal.     ED Results / Procedures / Treatments   Labs (all labs ordered are listed, but only  abnormal results are displayed) Labs Reviewed  RESP PANEL BY RT-PCR (RSV, FLU A&B, COVID)  [id number]  GROUP A STREP BY PCR    EKG None  Radiology No results found.  Procedures Procedures    Medications Ordered in ED Medications  metoCLOPramide (REGLAN) injection 10 mg (has no administration in time range)  diphenhydrAMINE (BENADRYL) capsule 25 mg (has no administration in time range)    ED Course/ Medical Decision Making/ A&P    Patient seen and examined. History obtained directly from patient. Work-up including labs, imaging, EKG ordered in triage, if performed, were reviewed.    Labs/EKG: Independently reviewed and interpreted.  This included: Viral panel negative, strep negative.  Imaging: None ordered  Medications/Fluids: Ordered: IM Reglan, p.o. Benadryl   Most recent vital signs reviewed and are as follows: BP 106/73   Pulse 78   Temp 97.7 F (36.5 C)   Resp 16   Ht 5\' 3"  (1.6 m)   Wt 65.3 kg   LMP  01/27/2023   SpO2 100%   BMI 25.51 kg/m   Initial impression: URI, headache without red flags  Home treatment plan: Rest, hydration, OTC meds.  Will give prescription for Tessalon.  Return instructions discussed with patient: New or worsening symptoms  Follow-up instructions discussed with patient: Follow-up with PCP in 5 days if not improving.                            Medical Decision Making Risk Prescription drug management.   Patient with upper respiratory symptoms suggestive of upper respiratory tract infection.  Strep negative.  Viral panel negative.  This is likely exacerbating headache symptoms.  In regards to the patient's headache, critical differentials were considered including subarachnoid hemorrhage, intracerebral hemorrhage, epidural/subdural hematoma, pituitary apoplexy, vertebral/carotid artery dissection, giant cell arteritis, central venous thrombosis, reversible cerebral vasoconstriction, acute angle closure glaucoma, idiopathic intracranial hypertension, bacterial meningitis, viral encephalitis, carbon monoxide poisoning, posterior reversible encephalopathy syndrome, pre-eclampsia.   Reg flag symptoms related to these causes were considered including systemic symptoms (fever, weight loss), neurologic symptoms (confusion, mental status change, vision change, associated seizure), acute or sudden "thunderclap" onset, patient age 3 or older with new or progressive headache, patient of any age with first headache or change in headache pattern, pregnant or postpartum status, history of HIV or other immunocompromise, history of cancer, headache occurring with exertion, associated neck or shoulder pain, associated traumatic injury, concurrent use of anticoagulation, family history of spontaneous SAH, and concurrent drug use.    Other benign, more common causes of headache were considered including migraine, tension-type headache, cluster headache, referred pain from other  cause such as sinus infection, dental pain, trigeminal neuralgia.   On exam, patient has a reassuring neuro exam including baseline mental status, no significant neck pain or meningeal signs, no signs of severe infection or fever.   The patient's vital signs, pertinent lab work and imaging were reviewed and interpreted as discussed in the ED course. Hospitalization was considered for further testing, treatments, or serial exams/observation. However as patient is well-appearing, has a stable exam over the course of their evaluation, and reassuring studies today, I do not feel that they warrant admission at this time. This plan was discussed with the patient who verbalizes agreement and comfort with this plan and seems reliable and able to return to the Emergency Department with worsening or changing symptoms.          Final Clinical Impression(s) / ED  Diagnoses Final diagnoses:  Upper respiratory tract infection, unspecified type  Acute nonintractable headache, unspecified headache type    Rx / DC Orders ED Discharge Orders          Ordered    benzonatate (TESSALON) 100 MG capsule  Every 8 hours        01/27/23 1408              Renne Crigler, PA-C 01/27/23 1413    Tegeler, Canary Brim, MD 01/27/23 1420
